# Patient Record
Sex: Male | Born: 2000 | Race: Black or African American | Hispanic: No | Marital: Single | Smoking: Never smoker
Health system: Southern US, Community
[De-identification: ages and names within clinical notes are randomized; demographics above are authoritative.]

## PROBLEM LIST (undated history)

## (undated) DIAGNOSIS — D573 Sickle-cell trait: Secondary | ICD-10-CM

## (undated) DIAGNOSIS — A749 Chlamydial infection, unspecified: Secondary | ICD-10-CM

---
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
## 2022-11-22 ENCOUNTER — Other Ambulatory Visit: Payer: Self-pay

## 2022-11-22 ENCOUNTER — Emergency Department (HOSPITAL_COMMUNITY)
Admission: EM | Admit: 2022-11-22 | Discharge: 2022-11-22 | Disposition: A | Discharge status: Home / Self Care | Payer: Medicaid Other | Attending: Emergency Medicine | Admitting: Emergency Medicine

## 2022-11-22 DIAGNOSIS — Z202 Contact with and (suspected) exposure to infections with a predominantly sexual mode of transmission: Secondary | ICD-10-CM | POA: Insufficient documentation

## 2022-11-22 DIAGNOSIS — R369 Urethral discharge, unspecified: Secondary | ICD-10-CM | POA: Insufficient documentation

## 2022-11-22 MED ORDER — DOXYCYCLINE HYCLATE 100 MG PO TABS
100.0000 mg | ORAL_TABLET | Freq: Once | ORAL | Status: AC
  Administered 2022-11-22: 100 mg via ORAL
  Filled 2022-11-22: qty 1

## 2022-11-22 MED ORDER — LIDOCAINE HCL (PF) 1 % IJ SOLN
INTRAMUSCULAR | Status: AC
  Administered 2022-11-22: 2.1 mL
  Filled 2022-11-22: qty 5

## 2022-11-22 MED ORDER — CEFTRIAXONE SODIUM 500 MG IJ SOLR
500.0000 mg | Freq: Once | INTRAMUSCULAR | Status: AC
  Administered 2022-11-22: 500 mg via INTRAMUSCULAR
  Filled 2022-11-22: qty 500

## 2022-11-22 MED ORDER — DOXYCYCLINE HYCLATE 100 MG PO CAPS: 100.0000 mg | ORAL_CAPSULE | Freq: Two times a day (BID) | ORAL | 0 refills | Status: DC

## 2022-11-22 NOTE — ED Provider Notes (Signed)
  [facility]-EMERGENCY DEPT [facility] Emergency Department Provider Note MRN:  [id number]  Arrival date & time: 11/22/22     Chief Complaint   STD screening ( penile discharge)    History of Present Illness   Eugene Bryant is a 21 y.o. year-old male presents to the ED with chief complaint of penile discharge x 3 days.  States that it is a clear discharge.  Happens daily.  States his sexual partner has unknown STD and he would like to be tested.  Denies masses, lumps, rashes, lesions.  Denies dysuria or fever.  History provided by patient.   Review of Systems  Pertinent positive and negative review of systems noted in HPI.    Physical Exam   Vitals:   11/22/22 2211  BP: 136/78  Pulse: 88  Resp: 16  Temp: 98.3 F (36.8 C)  SpO2: 98%    CONSTITUTIONAL:  well-appearing, NAD NEURO:  Alert and oriented x 3, CN 3-12 grossly intact EYES:  eyes equal and reactive ENT/NECK:  Supple, no stridor  CARDIO:  appears well-perfused  PULM:  No respiratory distress,  GI/GU:  non-distended, mild urethral discharge MSK/SPINE:  No gross deformities, no edema, moves all extremities  SKIN:  no rash, atraumatic   *Additional and/or pertinent findings included in MDM below  Diagnostic and Interventional Summary    EKG Interpretation  Date/Time:    Ventricular Rate:    PR Interval:    QRS Duration:   QT Interval:    QTC Calculation:   R Axis:     Text Interpretation:         Labs Reviewed - No data to display  No orders to display    Medications  cefTRIAXone (ROCEPHIN) injection 500 mg (has no administration in time range)  doxycycline (VIBRA-TABS) tablet 100 mg (has no administration in time range)     Procedures  /  Critical Care Procedures  ED Course and Medical Decision Making  I have reviewed the triage vital signs, the nursing notes, and pertinent available records from the EMR.  Social Determinants Affecting Complexity of Care: Patient has no clinically  significant social determinants affecting this chief complaint..   ED Course:    Medical Decision Making Risk Prescription drug management.     Consultants: No consultations were needed in caring for this patient.   Treatment and Plan: Emergency department workup does not suggest an emergent condition requiring admission or immediate intervention beyond  what has been performed at this time. The patient is safe for discharge and has  been instructed to return immediately for worsening symptoms, change in  symptoms or any other concerns    Final Clinical Impressions(s) / ED Diagnoses     ICD-10-CM   1. Penile discharge  R36.9       ED Discharge Orders          Ordered    doxycycline (VIBRAMYCIN) 100 MG capsule  2 times daily        11/22/22 2219              Discharge Instructions Discussed with and Provided to Patient:   Discharge Instructions   None      Roxy Horseman, PA-C 11/22/22 2222    Charlynne Pander, MD 11/22/22 [phone]
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
## 2022-11-23 LAB — GC/CHLAMYDIA PROBE AMP (~~LOC~~) NOT AT ARMC
Chlamydia: POSITIVE — AB
Comment: NEGATIVE
Comment: NORMAL
Neisseria Gonorrhea: NEGATIVE

## 2023-03-25 ENCOUNTER — Ambulatory Visit
Admission: RE | Admit: 2023-03-25 | Discharge: 2023-03-25 | Disposition: A | Discharge status: Home / Self Care | Payer: Self-pay | Source: Ambulatory Visit | Attending: Nurse Practitioner | Admitting: Nurse Practitioner

## 2023-03-25 VITALS — BP 120/74 | HR 55 | Temp 97.9°F | Resp 16

## 2023-03-25 DIAGNOSIS — R369 Urethral discharge, unspecified: Secondary | ICD-10-CM | POA: Insufficient documentation

## 2023-03-25 HISTORY — DX: Chlamydial infection, unspecified: A74.9

## 2023-03-25 MED ORDER — DOXYCYCLINE HYCLATE 100 MG PO CAPS: 100.0000 mg | ORAL_CAPSULE | Freq: Two times a day (BID) | ORAL | 0 refills | Status: AC

## 2023-03-25 NOTE — ED Triage Notes (Signed)
Pt c/o penile discharge onset approx 3 days ago.

## 2023-03-25 NOTE — ED Provider Notes (Signed)
[facility]    CSN: [id number] Arrival date & time: 03/25/23  1117      History   Chief Complaint Chief Complaint  Patient presents with   Penile Discharge    HPI Eugene Bryant is a 22 y.o. male presents for evaluation of penile discharge.  Patient reports 3 days of a runny penile discharge.  He denies any fevers, chills, testicular pain or swelling, dysuria.  He denies any known STD exposure but would like to be tested.  He has been treated for chlamydia in the past and states the symptoms are similar.  No OTC medications have been used.  No other concerns at this time.   Penile Discharge    Past Medical History:  Diagnosis Date   Chlamydia    Sickle cell trait     There are no problems to display for this patient.   History reviewed. No pertinent surgical history.     Home Medications    Prior to Admission medications   Medication Sig Start Date End Date Taking? Authorizing Provider  doxycycline (VIBRAMYCIN) 100 MG capsule Take 1 capsule (100 mg total) by mouth 2 (two) times daily for 7 days. 03/25/23 04/01/23 Yes Radford Pax, NP  ibuprofen (EQL IBUPROFEN) 200 MG tablet Take 3 tablets (600 mg total) by mouth 3 (three) times daily. 11/30/13   Reuben Likes, MD    Family History History reviewed. No pertinent family history.  Social History Social History   Tobacco Use   Smoking status: Never   Smokeless tobacco: Never  Vaping Use   Vaping Use: Every day  Substance Use Topics   Alcohol use: No   Drug use: Yes    Types: Marijuana     Allergies   Patient has no known allergies.   Review of Systems Review of Systems  Genitourinary:  Positive for penile discharge.     Physical Exam Triage Vital Signs ED Triage Vitals [03/25/23 1129]  Enc Vitals Group     BP 120/74     Pulse Rate (!) 55     Resp 16     Temp 97.9 F (36.6 C)     Temp Source Oral     SpO2 99 %     Weight      Height      Head Circumference      Peak Flow       Pain Score 0     Pain Loc      Pain Edu?      Excl. in GC?    No data found.  Updated Vital Signs BP 120/74   Pulse (!) 55   Temp 97.9 F (36.6 C) (Oral)   Resp 16   SpO2 99%   Visual Acuity Right Eye Distance:   Left Eye Distance:   Bilateral Distance:    Right Eye Near:   Left Eye Near:    Bilateral Near:     Physical Exam Vitals and nursing note reviewed.  Constitutional:      Appearance: Normal appearance.  HENT:     Head: Normocephalic and atraumatic.  Eyes:     Pupils: Pupils are equal, round, and reactive to light.  Cardiovascular:     Rate and Rhythm: Normal rate.  Pulmonary:     Effort: Pulmonary effort is normal.  Abdominal:     Tenderness: There is no right CVA tenderness or left CVA tenderness.  Skin:    General: Skin is warm and dry.  Neurological:     General: No focal deficit present.     Mental Status: He is alert and oriented to person, place, and time.  Psychiatric:        Mood and Affect: Mood normal.        Behavior: Behavior normal.      UC Treatments / Results  Labs (all labs ordered are listed, but only abnormal results are displayed) Labs Reviewed  RPR  HIV ANTIBODY (ROUTINE TESTING W REFLEX)  CYTOLOGY, (ORAL, ANAL, URETHRAL) ANCILLARY ONLY    EKG   Radiology No results found.  Procedures Procedures (including critical care time)  Medications Ordered in UC Medications - No data to display  Initial Impression / Assessment and Plan / UC Course  I have reviewed the triage vital signs and the nursing notes.  Pertinent labs & imaging results that were available during my care of the patient were reviewed by me and considered in my medical decision making (see chart for details).     STD testing is ordered and will contact for any positive results Start doxycycline twice daily for 7 days PCP follow-up if symptoms do not improve ER precautions reviewed and patient verbalized understanding Final Clinical Impressions(s)  / UC Diagnoses   Final diagnoses:  Penile discharge     Discharge Instructions      The clinic will contact you with results of the testing done today if positive Start doxycycline twice daily for 7 days Follow-up with your PCP if your symptoms or not improving Please go to the emergency room if you have any worsening symptoms    ED Prescriptions     Medication Sig Dispense Auth. Provider   doxycycline (VIBRAMYCIN) 100 MG capsule Take 1 capsule (100 mg total) by mouth 2 (two) times daily for 7 days. 14 capsule Radford Pax, NP      PDMP not reviewed this encounter.   Radford Pax, NP 03/25/23 1145

## 2023-03-25 NOTE — Discharge Instructions (Signed)
The clinic will contact you with results of the testing done today if positive Start doxycycline twice daily for 7 days Follow-up with your PCP if your symptoms or not improving Please go to the emergency room if you have any worsening symptoms

## 2023-03-26 LAB — CYTOLOGY, (ORAL, ANAL, URETHRAL) ANCILLARY ONLY
Chlamydia: NEGATIVE
Comment: NEGATIVE
Comment: NEGATIVE
Comment: NORMAL
Neisseria Gonorrhea: NEGATIVE
Trichomonas: NEGATIVE

## 2023-03-27 LAB — HIV ANTIBODY (ROUTINE TESTING W REFLEX): HIV Screen 4th Generation wRfx: NONREACTIVE

## 2023-03-27 LAB — SYPHILIS: RPR W/REFLEX TO RPR TITER AND TREPONEMAL ANTIBODIES, TRADITIONAL SCREENING AND DIAGNOSIS ALGORITHM: RPR Ser Ql: NONREACTIVE

## 2023-04-25 ENCOUNTER — Encounter (HOSPITAL_COMMUNITY): Payer: Self-pay

## 2023-04-25 ENCOUNTER — Ambulatory Visit (HOSPITAL_COMMUNITY)
Admission: RE | Admit: 2023-04-25 | Discharge: 2023-04-25 | Disposition: A | Discharge status: Home / Self Care | Payer: Medicaid Other | Source: Ambulatory Visit | Attending: Physician Assistant | Admitting: Physician Assistant

## 2023-04-25 VITALS — BP 111/74 | HR 90 | Temp 99.0°F | Resp 14

## 2023-04-25 DIAGNOSIS — R369 Urethral discharge, unspecified: Secondary | ICD-10-CM | POA: Diagnosis present

## 2023-04-25 DIAGNOSIS — R300 Dysuria: Secondary | ICD-10-CM | POA: Insufficient documentation

## 2023-04-25 LAB — POCT URINALYSIS DIP (MANUAL ENTRY)
Blood, UA: NEGATIVE
Glucose, UA: NEGATIVE mg/dL
Ketones, POC UA: NEGATIVE mg/dL
Nitrite, UA: NEGATIVE
Protein Ur, POC: 30 mg/dL — AB
Spec Grav, UA: 1.025 (ref 1.010–1.025)
Urobilinogen, UA: 1 E.U./dL
pH, UA: 6 (ref 5.0–8.0)

## 2023-04-25 MED ORDER — DOXYCYCLINE HYCLATE 100 MG PO CAPS: 100.0000 mg | ORAL_CAPSULE | Freq: Two times a day (BID) | ORAL | 0 refills | Status: DC

## 2023-04-25 NOTE — ED Provider Notes (Signed)
[facility]    CSN: [id number] Arrival date & time: 04/25/23  1654      History   Chief Complaint Chief Complaint  Patient presents with   appt 5   Penile Discharge    HPI Eugene Bryant is a 22 y.o. male.   Patient presents today with a 24-hour history of dysuria and penile discharge.  He describes penile discharge as runny and white.  He denies any genital lesions, abdominal pain, pelvic pain, fever, nausea, vomiting.  He does have a history of chlamydia and reports current symptoms are similar to previous episodes of this condition.  He was seen by our clinic on 03/25/2023 with similar symptoms and empirically treated with doxycycline.  Ultimately his STI swab was negative and he also tested negative for HIV and syphilis.  He is sexually active with male partners and typically uses condoms.  He denies additional antibiotic use in the past 90 days.  He is very concerned about ongoing symptoms and is requesting treatment today.  He has not seen a urologist in the past.  Denies any history of urogenital procedures or catheterization.  Denies history of diabetes and does not take SGLT2 inhibitor.    Past Medical History:  Diagnosis Date   Chlamydia    Sickle cell trait (HCC)     There are no problems to display for this patient.   History reviewed. No pertinent surgical history.     Home Medications    Prior to Admission medications   Medication Sig Start Date End Date Taking? Authorizing Provider  doxycycline (VIBRAMYCIN) 100 MG capsule Take 1 capsule (100 mg total) by mouth 2 (two) times daily. 04/25/23  Yes Hiroyuki Ozanich K, PA-C  ibuprofen (EQL IBUPROFEN) 200 MG tablet Take 3 tablets (600 mg total) by mouth 3 (three) times daily. 11/30/13   Reuben Likes, MD    Family History History reviewed. No pertinent family history.  Social History Social History   Tobacco Use   Smoking status: Never   Smokeless tobacco: Never  Vaping Use   Vaping Use: Every day   Substance Use Topics   Alcohol use: No   Drug use: Yes    Types: Marijuana     Allergies   Patient has no known allergies.   Review of Systems Review of Systems  Constitutional:  Positive for activity change. Negative for appetite change, fatigue and fever.  Gastrointestinal:  Negative for abdominal pain, diarrhea, nausea and vomiting.  Genitourinary:  Positive for dysuria and penile discharge. Negative for frequency, genital sores, hematuria, penile pain and urgency.     Physical Exam Triage Vital Signs ED Triage Vitals [04/25/23 1725]  Enc Vitals Group     BP 111/74     Pulse Rate 90     Resp 14     Temp 99 F (37.2 C)     Temp Source Oral     SpO2 98 %     Weight      Height      Head Circumference      Peak Flow      Pain Score 0     Pain Loc      Pain Edu?      Excl. in GC?    No data found.  Updated Vital Signs BP 111/74 (BP Location: Left Arm)   Pulse 90   Temp 99 F (37.2 C) (Oral)   Resp 14   SpO2 98%   Visual Acuity Right Eye Distance:  Left Eye Distance:   Bilateral Distance:    Right Eye Near:   Left Eye Near:    Bilateral Near:     Physical Exam Vitals reviewed.  Constitutional:      General: He is awake.     Appearance: Normal appearance. He is well-developed. He is not ill-appearing.     Comments: Very pleasant male appears stated age no acute distress sitting comfortably in exam room  HENT:     Head: Normocephalic and atraumatic.  Cardiovascular:     Rate and Rhythm: Normal rate and regular rhythm.     Heart sounds: Normal heart sounds, S1 normal and S2 normal. No murmur heard. Pulmonary:     Effort: Pulmonary effort is normal.     Breath sounds: Normal breath sounds. No stridor. No wheezing, rhonchi or rales.     Comments: Clear to auscultation bilaterally Abdominal:     General: Bowel sounds are normal.     Palpations: Abdomen is soft.     Tenderness: There is no abdominal tenderness. There is no right CVA tenderness,  left CVA tenderness, guarding or rebound.  Genitourinary:    Comments: Exam declined Neurological:     Mental Status: He is alert.  Psychiatric:        Behavior: Behavior is cooperative.      UC Treatments / Results  Labs (all labs ordered are listed, but only abnormal results are displayed) Labs Reviewed  POCT URINALYSIS DIP (MANUAL ENTRY) - Abnormal; Notable for the following components:      Result Value   Bilirubin, UA small (*)    Protein Ur, POC =30 (*)    Leukocytes, UA Trace (*)    All other components within normal limits  URINE CULTURE  CYTOLOGY, (ORAL, ANAL, URETHRAL) ANCILLARY ONLY    EKG   Radiology No results found.  Procedures Procedures (including critical care time)  Medications Ordered in UC Medications - No data to display  Initial Impression / Assessment and Plan / UC Course  I have reviewed the triage vital signs and the nursing notes.  Pertinent labs & imaging results that were available during my care of the patient were reviewed by me and considered in my medical decision making (see chart for details).     Patient is well-appearing, afebrile, nontoxic, nontachycardic.  UA showed trace leukocytes so we will send this for culture given he is having dysuria but defer additional antibiotic treatment until culture results are available.  STI swab was collected today and is pending.  We discussed potential utility of waiting until we have results given he has had similar symptoms in the past with negative testing but patient reports that he is very concerned about chlamydia and would like to initiate treatment.  Will treat for chlamydia with doxycycline 100 mg twice daily for 7 days.  Discussed that he is to avoid prolonged sun exposure while on this medication.  Offered HIV and syphilis testing which patient declined as he had this approximately 1 month ago and it was negative.  We will contact him if we need to arrange additional treatment based on his  swab result.  Discussed the importance of safe sex practices.  He is to abstain from sex until 1 week after completing course of treatment.  If he can test negative on the STI swab he will need to see urology for further evaluation and management was given contact information for local provider with instruction to call to schedule an appointment.  Strict  return precautions given.  Final Clinical Impressions(s) / UC Diagnoses   Final diagnoses:  Penile discharge  Dysuria     Discharge Instructions      We are treating you for chlamydia.  Take doxycycline 100 mg twice daily for 7 days.  Abstain from sex for 1 week after completing course of medication (a minimum of 2 weeks).  We will contact you if your swab is positive for anything else we need to arrange treatment.  If you test negative and you continue to have symptoms I do recommend that you follow-up with urologist; call to schedule an appointment.  Make sure that you are drinking plenty of fluid.  If you have any worsening symptoms including genital lesions, pelvic pain, abdominal pain, fever, nausea, vomiting you should be seen immediately.     ED Prescriptions     Medication Sig Dispense Auth. Provider   doxycycline (VIBRAMYCIN) 100 MG capsule Take 1 capsule (100 mg total) by mouth 2 (two) times daily. 14 capsule Lakiya Cottam K, PA-C      PDMP not reviewed this encounter.   Jeani Hawking, PA-C 04/25/23 1748

## 2023-04-25 NOTE — ED Triage Notes (Signed)
Pt c/o white penile discharge since this morning with little penile irritation. Requesting STD testing.

## 2023-04-25 NOTE — Discharge Instructions (Addendum)
We are treating you for chlamydia.  Take doxycycline 100 mg twice daily for 7 days.  Abstain from sex for 1 week after completing course of medication (a minimum of 2 weeks).  We will contact you if your swab is positive for anything else we need to arrange treatment.  If you test negative and you continue to have symptoms I do recommend that you follow-up with urologist; call to schedule an appointment.  Make sure that you are drinking plenty of fluid.  If you have any worsening symptoms including genital lesions, pelvic pain, abdominal pain, fever, nausea, vomiting you should be seen immediately.

## 2023-04-26 LAB — URINE CULTURE: Culture: NO GROWTH

## 2023-04-26 LAB — CYTOLOGY, (ORAL, ANAL, URETHRAL) ANCILLARY ONLY
Chlamydia: POSITIVE — AB
Comment: NEGATIVE
Comment: NEGATIVE
Comment: NORMAL
Neisseria Gonorrhea: NEGATIVE
Trichomonas: NEGATIVE

## 2023-07-25 ENCOUNTER — Ambulatory Visit
Admission: EM | Admit: 2023-07-25 | Discharge: 2023-07-25 | Disposition: A | Discharge status: Home / Self Care | Payer: Medicaid Other | Attending: Physician Assistant | Admitting: Physician Assistant

## 2023-07-25 DIAGNOSIS — Z202 Contact with and (suspected) exposure to infections with a predominantly sexual mode of transmission: Secondary | ICD-10-CM | POA: Insufficient documentation

## 2023-07-25 MED ORDER — DOXYCYCLINE HYCLATE 100 MG PO CAPS: 100.0000 mg | ORAL_CAPSULE | Freq: Two times a day (BID) | ORAL | 0 refills | Status: DC

## 2023-07-25 NOTE — ED Provider Notes (Signed)
[facility]    CSN: [id number] Arrival date & time: 07/25/23  1000      History   Chief Complaint Chief Complaint  Patient presents with   Exposure to STD    HPI Eugene Bryant is a 22 y.o. male.   Patient today for evaluation of possible STD after he had known exposure to chlamydia.  He states he has had some clear discharge as well as some mild dysuria.  He does not report any fever.  He has not had any nausea or vomiting.  The history is provided by the patient.    Past Medical History:  Diagnosis Date   Chlamydia    Sickle cell trait (HCC)     There are no problems to display for this patient.   History reviewed. No pertinent surgical history.     Home Medications    Prior to Admission medications   Medication Sig Start Date End Date Taking? Authorizing Provider  doxycycline (VIBRAMYCIN) 100 MG capsule Take 1 capsule (100 mg total) by mouth 2 (two) times daily. 07/25/23  Yes Tomi Bamberger, PA-C  ibuprofen (EQL IBUPROFEN) 200 MG tablet Take 3 tablets (600 mg total) by mouth 3 (three) times daily. 11/30/13   Reuben Likes, MD    Family History History reviewed. No pertinent family history.  Social History Social History   Tobacco Use   Smoking status: Never   Smokeless tobacco: Never  Vaping Use   Vaping status: Every Day  Substance Use Topics   Alcohol use: No   Drug use: Yes    Types: Marijuana     Allergies   Patient has no known allergies.   Review of Systems Review of Systems  Constitutional:  Negative for chills and fever.  Eyes:  Negative for discharge and redness.  Genitourinary:  Positive for dysuria and penile discharge. Negative for genital sores.  Neurological:  Negative for numbness.     Physical Exam Triage Vital Signs ED Triage Vitals  Encounter Vitals Group     BP      Systolic BP Percentile      Diastolic BP Percentile      Pulse      Resp      Temp      Temp src      SpO2      Weight       Height      Head Circumference      Peak Flow      Pain Score      Pain Loc      Pain Education      Exclude from Growth Chart    No data found.  Updated Vital Signs BP 130/74 (BP Location: Left Arm)   Pulse 70   Temp 98.7 F (37.1 C) (Oral)   Resp 18   SpO2 98%     Physical Exam Vitals and nursing note reviewed.  Constitutional:      General: He is not in acute distress.    Appearance: Normal appearance. He is not ill-appearing.  HENT:     Head: Normocephalic and atraumatic.  Eyes:     Conjunctiva/sclera: Conjunctivae normal.  Cardiovascular:     Rate and Rhythm: Normal rate.  Pulmonary:     Effort: Pulmonary effort is normal. No respiratory distress.  Neurological:     Mental Status: He is alert.  Psychiatric:        Mood and Affect: Mood normal.  Behavior: Behavior normal.        Thought Content: Thought content normal.      UC Treatments / Results  Labs (all labs ordered are listed, but only abnormal results are displayed) Labs Reviewed  CYTOLOGY, (ORAL, ANAL, URETHRAL) ANCILLARY ONLY    EKG   Radiology No results found.  Procedures Procedures (including critical care time)  Medications Ordered in UC Medications - No data to display  Initial Impression / Assessment and Plan / UC Course  I have reviewed the triage vital signs and the nursing notes.  Pertinent labs & imaging results that were available during my care of the patient were reviewed by me and considered in my medical decision making (see chart for details).    Doxycycline prescribed to cover chlamydia and STD screening ordered to rule out any additional infection.  Patient declines blood work.  Encouraged follow up if no gradual improvement or with any further concerns.   Final Clinical Impressions(s) / UC Diagnoses   Final diagnoses:  Exposure to chlamydia   Discharge Instructions   None    ED Prescriptions     Medication Sig Dispense Auth. Provider   doxycycline  (VIBRAMYCIN) 100 MG capsule Take 1 capsule (100 mg total) by mouth 2 (two) times daily. 20 capsule Tomi Bamberger, PA-C      PDMP not reviewed this encounter.   Tomi Bamberger, PA-C 07/25/23 1048

## 2023-07-25 NOTE — ED Triage Notes (Signed)
Pt presents with clear penile discharge and burning with urination that started a week ago. Pt states he was exposed to chlamydia.

## 2023-08-15 ENCOUNTER — Encounter (HOSPITAL_COMMUNITY): Payer: Self-pay

## 2023-08-15 ENCOUNTER — Ambulatory Visit (HOSPITAL_COMMUNITY)
Admission: EM | Admit: 2023-08-15 | Discharge: 2023-08-15 | Disposition: A | Discharge status: Home / Self Care | Payer: Medicaid Other | Attending: Urgent Care | Admitting: Urgent Care

## 2023-08-15 DIAGNOSIS — K047 Periapical abscess without sinus: Secondary | ICD-10-CM | POA: Diagnosis not present

## 2023-08-15 MED ORDER — CHLORHEXIDINE GLUCONATE 0.12 % MT SOLN: 15.0000 mL | Freq: Two times a day (BID) | OROMUCOSAL | 0 refills | Status: DC

## 2023-08-15 MED ORDER — AMOXICILLIN-POT CLAVULANATE 875-125 MG PO TABS: 1.0000 | ORAL_TABLET | Freq: Two times a day (BID) | ORAL | 0 refills | Status: DC

## 2023-08-15 MED ORDER — CELECOXIB 200 MG PO CAPS: 200.0000 mg | ORAL_CAPSULE | Freq: Two times a day (BID) | ORAL | 0 refills | Status: DC

## 2023-08-15 NOTE — Discharge Instructions (Addendum)
Please start taking the antibiotic prescribed.  This will help prevent any worsening infection. Take it twice daily with food. It may cause soft stools. Start taking the medication for the pain as prescribed.  Use only for moderate pain; take with food. For milder pain, you may take up to 1000 mg of Tylenol every 8 hours as needed. Swish and spit the prescribed mouth wash twice daily; swish for 5 minutes. Please follow-up with a dentist as soon as possible for further evaluation.

## 2023-08-15 NOTE — ED Triage Notes (Signed)
Patient with c/o left lower dental pain. States he had a tooth chip and now it is swollen. States he took some sort of "headache medicine" PTA.

## 2023-08-15 NOTE — ED Provider Notes (Signed)
[facility]    CSN: [id number] Arrival date & time: 08/15/23  1811      History   Chief Complaint Chief Complaint  Patient presents with   Dental Pain    HPI Eugene Bryant is a 22 y.o. male.   Eugene Bryant male presents today due to pain in his gums and teeth primarily on the bottom R jaw near the back three molars. States that it has been bothering him for several months. Has noted a "bump" in the past and has been able to squeeze out pus. States over the past two weeks however, the swelling and pain has gotten worse. Reports an 8/10 pain currently. Has not been taking anything OTC for it. Denies lymphadenopathy, fever or dysphagia. No drooling or trismus.   Dental Pain   Past Medical History:  Diagnosis Date   Chlamydia    Sickle cell trait (HCC)     There are no problems to display for this patient.   History reviewed. No pertinent surgical history.     Home Medications    Prior to Admission medications   Medication Sig Start Date End Date Taking? Authorizing Provider  amoxicillin-clavulanate (AUGMENTIN) 875-125 MG tablet Take 1 tablet by mouth 2 (two) times daily with a meal. 08/15/23  Yes Arlynn Stare L, PA  celecoxib (CELEBREX) 200 MG capsule Take 1 capsule (200 mg total) by mouth 2 (two) times daily. 08/15/23  Yes Lanee Chain L, PA  chlorhexidine (PERIDEX) 0.12 % solution Use as directed 15 mLs in the mouth or throat 2 (two) times daily. Swish for up to 5 minutes, then spit out 08/15/23  Yes Gema Ringold L, PA    Family History History reviewed. No pertinent family history.  Social History Social History   Tobacco Use   Smoking status: Never   Smokeless tobacco: Never  Vaping Use   Vaping status: Every Day  Substance Use Topics   Alcohol use: No   Drug use: Yes    Types: Marijuana     Allergies   Patient has no known allergies.   Review of Systems Review of Systems As per HPI  Physical Exam Triage Vital Signs ED Triage Vitals   Encounter Vitals Group     BP 08/15/23 1859 131/72     Systolic BP Percentile --      Diastolic BP Percentile --      Pulse Rate 08/15/23 1859 77     Resp 08/15/23 1859 16     Temp 08/15/23 1859 98.4 F (36.9 C)     Temp Source 08/15/23 1859 Oral     SpO2 08/15/23 1859 98 %     Weight --      Height --      Head Circumference --      Peak Flow --      Pain Score 08/15/23 1900 7     Pain Loc --      Pain Education --      Exclude from Growth Chart --    No data found.  Updated Vital Signs BP 131/72 (BP Location: Left Arm)   Pulse 77   Temp 98.4 F (36.9 C) (Oral)   Resp 16   SpO2 98%   Visual Acuity Right Eye Distance:   Left Eye Distance:   Bilateral Distance:    Right Eye Near:   Left Eye Near:    Bilateral Near:     Physical Exam Constitutional:      General: He is not  in acute distress.    Appearance: Normal appearance. He is normal weight. He is not ill-appearing, toxic-appearing or diaphoretic.  HENT:     Head: Normocephalic.     Mouth/Throat:     Lips: Pink. No lesions.     Mouth: Mucous membranes are moist.     Dentition: Abnormal dentition. Dental tenderness, gingival swelling, dental caries, dental abscesses and gum lesions present.     Palate: No mass and lesions.     Pharynx: Oropharynx is clear. Uvula midline. No pharyngeal swelling, oropharyngeal exudate, posterior oropharyngeal erythema, uvula swelling or postnasal drip.   Cardiovascular:     Rate and Rhythm: Normal rate.  Pulmonary:     Effort: Pulmonary effort is normal. No respiratory distress.  Musculoskeletal:     Cervical back: Normal range of motion and neck supple. No rigidity or tenderness.  Lymphadenopathy:     Cervical: No cervical adenopathy.  Skin:    Findings: No rash.  Neurological:     Mental Status: He is alert.      UC Treatments / Results  Labs (all labs ordered are listed, but only abnormal results are displayed) Labs Reviewed - No data to  display  EKG   Radiology No results found.  Procedures Procedures (including critical care time)  Medications Ordered in UC Medications - No data to display  Initial Impression / Assessment and Plan / UC Course  I have reviewed the triage vital signs and the nursing notes.  Pertinent labs & imaging results that were available during my care of the patient were reviewed by me and considered in my medical decision making (see chart for details).     Dental abscess - no systemic s/sx. Will start PO Augmentin and chlorhexidine for infection. Celebrex for pain/ swelling. Pt to contact dentist ASAP to schedule further eval and consult. Pt understands that if he develops fever, facial swelling or trismus he must head to ER  Final Clinical Impressions(s) / UC Diagnoses   Final diagnoses:  Dental abscess     Discharge Instructions      Please start taking the antibiotic prescribed.  This will help prevent any worsening infection. Take it twice daily with food. It may cause soft stools. Start taking the medication for the pain as prescribed.  Use only for moderate pain; take with food. For milder pain, you may take up to 1000 mg of Tylenol every 8 hours as needed. Swish and spit the prescribed mouth wash twice daily; swish for 5 minutes. Please follow-up with a dentist as soon as possible for further evaluation.     ED Prescriptions     Medication Sig Dispense Auth. Provider   amoxicillin-clavulanate (AUGMENTIN) 875-125 MG tablet Take 1 tablet by mouth 2 (two) times daily with a meal. 20 tablet Muhanad Torosyan L, PA   chlorhexidine (PERIDEX) 0.12 % solution Use as directed 15 mLs in the mouth or throat 2 (two) times daily. Swish for up to 5 minutes, then spit out 120 mL Trenna Kiely L, PA   celecoxib (CELEBREX) 200 MG capsule Take 1 capsule (200 mg total) by mouth 2 (two) times daily. 20 capsule Chakia Counts L, PA      PDMP not reviewed this encounter.   Maretta Bees, [place] 08/15/23 2259

## 2023-09-21 ENCOUNTER — Ambulatory Visit
Admission: EM | Admit: 2023-09-21 | Discharge: 2023-09-21 | Disposition: A | Discharge status: Home / Self Care | Payer: Medicaid Other | Attending: Internal Medicine | Admitting: Internal Medicine

## 2023-09-21 DIAGNOSIS — R369 Urethral discharge, unspecified: Secondary | ICD-10-CM | POA: Insufficient documentation

## 2023-09-21 MED ORDER — DOXYCYCLINE HYCLATE 100 MG PO CAPS: 100.0000 mg | ORAL_CAPSULE | Freq: Two times a day (BID) | ORAL | 0 refills | Status: AC

## 2023-09-21 NOTE — ED Triage Notes (Signed)
Pt states he was sexually active with his current partner while on the 7 day treatment for chlamydia and wants to be retested. Pt states he is having penial discharge that started 2 days ago.

## 2023-09-21 NOTE — ED Provider Notes (Signed)
[facility]    CSN: [id number] Arrival date & time: 09/21/23  1040      History   Chief Complaint Chief Complaint  Patient presents with   Exposure to STD    HPI Eugene Bryant is a 22 y.o. male presents for penile discharge. Pt reports 2 days of a penile discharge. He was treated for chlamydia in August with resolution of sx. he did state he was continuing to have intercourse with his partner during treatment.  He states his partner was also treated at that time.  He denies any known STD exposure given his current symptoms.  No dysuria, fevers.  He states his symptoms are consistent with his previous chlamydia symptoms.  No other concerns at this time.  Exposure to STD    Past Medical History:  Diagnosis Date   Chlamydia    Sickle cell trait (HCC)     There are no problems to display for this patient.   History reviewed. No pertinent surgical history.     Home Medications    Prior to Admission medications   Medication Sig Start Date End Date Taking? Authorizing Provider  doxycycline (VIBRAMYCIN) 100 MG capsule Take 1 capsule (100 mg total) by mouth 2 (two) times daily for 7 days. 09/21/23 09/28/23 Yes Radford Pax, NP  celecoxib (CELEBREX) 200 MG capsule Take 1 capsule (200 mg total) by mouth 2 (two) times daily. 08/15/23   Crain, Whitney L, PA  chlorhexidine (PERIDEX) 0.12 % solution Use as directed 15 mLs in the mouth or throat 2 (two) times daily. Swish for up to 5 minutes, then spit out 08/15/23   Maretta Bees, PA    Family History History reviewed. No pertinent family history.  Social History Social History   Tobacco Use   Smoking status: Never   Smokeless tobacco: Never  Vaping Use   Vaping status: Every Day  Substance Use Topics   Alcohol use: No   Drug use: Yes    Types: Marijuana     Allergies   Patient has no known allergies.   Review of Systems Review of Systems  Genitourinary:  Positive for penile discharge.      Physical Exam Triage Vital Signs ED Triage Vitals  Encounter Vitals Group     BP 09/21/23 1133 123/72     Systolic BP Percentile --      Diastolic BP Percentile --      Pulse Rate 09/21/23 1133 61     Resp 09/21/23 1133 16     Temp 09/21/23 1133 98.8 F (37.1 C)     Temp Source 09/21/23 1133 Oral     SpO2 09/21/23 1133 98 %     Weight --      Height --      Head Circumference --      Peak Flow --      Pain Score 09/21/23 1134 0     Pain Loc --      Pain Education --      Exclude from Growth Chart --    No data found.  Updated Vital Signs BP 123/72 (BP Location: Left Arm)   Pulse 61   Temp 98.8 F (37.1 C) (Oral)   Resp 16   SpO2 98%   Visual Acuity Right Eye Distance:   Left Eye Distance:   Bilateral Distance:    Right Eye Near:   Left Eye Near:    Bilateral Near:     Physical Exam Vitals and  nursing note reviewed.  Constitutional:      Appearance: Normal appearance.  HENT:     Head: Normocephalic and atraumatic.  Eyes:     Pupils: Pupils are equal, round, and reactive to light.  Cardiovascular:     Rate and Rhythm: Normal rate.  Pulmonary:     Effort: Pulmonary effort is normal.  Skin:    General: Skin is warm and dry.  Neurological:     General: No focal deficit present.     Mental Status: He is alert and oriented to person, place, and time.  Psychiatric:        Mood and Affect: Mood normal.        Behavior: Behavior normal.      UC Treatments / Results  Labs (all labs ordered are listed, but only abnormal results are displayed) Labs Reviewed  CYTOLOGY, (ORAL, ANAL, URETHRAL) ANCILLARY ONLY    EKG   Radiology No results found.  Procedures Procedures (including critical care time)  Medications Ordered in UC Medications - No data to display  Initial Impression / Assessment and Plan / UC Course  I have reviewed the triage vital signs and the nursing notes.  Pertinent labs & imaging results that were available during my care  of the patient were reviewed by me and considered in my medical decision making (see chart for details).     Reviewed exam and symptoms with patient.  No red flags.  STD testing is ordered and will contact for any positive results.  Will start doxycycline twice daily for 7 days given sx.  Patient was informed no sexual intercourse for 7 days while on treatment as well as for 7 days after he completes treatment and he verbalized understanding.  Follow-up with PCP if symptoms do not improve.  ER precautions reviewed. Final Clinical Impressions(s) / UC Diagnoses   Final diagnoses:  Penile discharge     Discharge Instructions      The clinic will contact you with results of the testing done today if positive.  Please start doxycycline twice daily for 7 days.  Please avoid sexual intercourse for the 7 days while you are on treatment as well as for 7 days after you complete treatment.  Please follow-up with your PCP if your symptoms do not improve.  Please go to the emergency room if you develop any worsening symptoms.  I hope you feel better soon!    ED Prescriptions     Medication Sig Dispense Auth. Provider   doxycycline (VIBRAMYCIN) 100 MG capsule Take 1 capsule (100 mg total) by mouth 2 (two) times daily for 7 days. 14 capsule Radford Pax, NP      PDMP not reviewed this encounter.   Radford Pax, NP 09/21/23 [phone]

## 2023-09-21 NOTE — Discharge Instructions (Signed)
The clinic will contact you with results of the testing done today if positive.  Please start doxycycline twice daily for 7 days.  Please avoid sexual intercourse for the 7 days while you are on treatment as well as for 7 days after you complete treatment.  Please follow-up with your PCP if your symptoms do not improve.  Please go to the emergency room if you develop any worsening symptoms.  I hope you feel better soon!

## 2023-09-24 LAB — CYTOLOGY, (ORAL, ANAL, URETHRAL) ANCILLARY ONLY
Chlamydia: NEGATIVE
Comment: NEGATIVE
Comment: NEGATIVE
Comment: NORMAL
Neisseria Gonorrhea: NEGATIVE
Trichomonas: NEGATIVE

## 2024-01-02 ENCOUNTER — Ambulatory Visit: Payer: Medicaid Other

## 2024-01-03 ENCOUNTER — Ambulatory Visit: Payer: Medicaid Other

## 2024-01-04 ENCOUNTER — Encounter: Payer: Self-pay | Admitting: Emergency Medicine

## 2024-01-04 ENCOUNTER — Ambulatory Visit
Admission: EM | Admit: 2024-01-04 | Discharge: 2024-01-04 | Disposition: A | Discharge status: Home / Self Care | Payer: Medicaid Other | Attending: Internal Medicine | Admitting: Internal Medicine

## 2024-01-04 DIAGNOSIS — Z113 Encounter for screening for infections with a predominantly sexual mode of transmission: Secondary | ICD-10-CM | POA: Diagnosis present

## 2024-01-04 DIAGNOSIS — R300 Dysuria: Secondary | ICD-10-CM | POA: Insufficient documentation

## 2024-01-04 LAB — POCT URINALYSIS DIP (MANUAL ENTRY)
Bilirubin, UA: NEGATIVE
Blood, UA: NEGATIVE
Glucose, UA: NEGATIVE mg/dL
Ketones, POC UA: NEGATIVE mg/dL
Nitrite, UA: NEGATIVE
Protein Ur, POC: NEGATIVE mg/dL
Spec Grav, UA: 1.02 (ref 1.010–1.025)
Urobilinogen, UA: 0.2 U/dL
pH, UA: 6 (ref 5.0–8.0)

## 2024-01-04 MED ORDER — NITROFURANTOIN MONOHYD MACRO 100 MG PO CAPS: 100.0000 mg | ORAL_CAPSULE | Freq: Two times a day (BID) | ORAL | 0 refills | Status: DC

## 2024-01-04 NOTE — Discharge Instructions (Signed)
If the urine culture and STD test come back negative and you still have the burning, please go follow up with a urologist.

## 2024-01-04 NOTE — ED Provider Notes (Signed)
[facility]    CSN: [id number] Arrival date & time: 01/04/24  0849      History   Chief Complaint Chief Complaint  Patient presents with   Exposure to STD    HPI Eugene Bryant is a 23 y.o. male presents requesting STD testing. Denies knowledge of exposure with someone who is positive for STD's. Has been having intermittent dysuria since early December. Declined blood work. Has been in a monogamous relationship x 1 year with a male partner.     Past Medical History:  Diagnosis Date   Chlamydia    Sickle cell trait (HCC)     There are no active problems to display for this patient.   History reviewed. No pertinent surgical history.     Home Medications    Prior to Admission medications   Medication Sig Start Date End Date Taking? Authorizing Provider  celecoxib (CELEBREX) 200 MG capsule Take 1 capsule (200 mg total) by mouth 2 (two) times daily. Patient not taking: Reported on 01/04/2024 08/15/23   Guy Sandifer L, PA  chlorhexidine (PERIDEX) 0.12 % solution Use as directed 15 mLs in the mouth or throat 2 (two) times daily. Swish for up to 5 minutes, then spit out Patient not taking: Reported on 01/04/2024 08/15/23   Maretta Bees, PA    Family History History reviewed. No pertinent family history.  Social History Social History   Tobacco Use   Smoking status: Never   Smokeless tobacco: Never  Vaping Use   Vaping status: Every Day   Substances: Nicotine  Substance Use Topics   Alcohol use: Yes    Comment: social   Drug use: Yes    Types: Marijuana     Allergies   Patient has no known allergies.   Review of Systems Review of Systems  As noted in HPI Physical Exam Triage Vital Signs ED Triage Vitals  Encounter Vitals Group     BP 01/04/24 1114 124/72     Systolic BP Percentile --      Diastolic BP Percentile --      Pulse Rate 01/04/24 1114 78     Resp 01/04/24 1114 16     Temp 01/04/24 1114 98.5 F (36.9 C)     Temp  Source 01/04/24 1114 Oral     SpO2 01/04/24 1114 99 %     Weight --      Height --      Head Circumference --      Peak Flow --      Pain Score 01/04/24 1116 0     Pain Loc --      Pain Education --      Exclude from Growth Chart --    No data found.  Updated Vital Signs BP 124/72 (BP Location: Left Arm)   Pulse 78   Temp 98.5 F (36.9 C) (Oral)   Resp 16   SpO2 99%   Visual Acuity Right Eye Distance:   Left Eye Distance:   Bilateral Distance:    Right Eye Near:   Left Eye Near:    Bilateral Near:     Physical Exam Vitals and nursing note reviewed.  Constitutional:      General: He is not in acute distress.    Appearance: He is normal weight. He is not toxic-appearing.  HENT:     Right Ear: External ear normal.     Left Ear: External ear normal.     Nose: Nose normal.  Eyes:  General: No scleral icterus.    Conjunctiva/sclera: Conjunctivae normal.  Pulmonary:     Effort: Pulmonary effort is normal.  Musculoskeletal:        General: Normal range of motion.     Cervical back: Neck supple.  Skin:    General: Skin is warm and dry.  Neurological:     Mental Status: He is alert and oriented to person, place, and time.     Gait: Gait normal.  Psychiatric:        Mood and Affect: Mood normal.        Behavior: Behavior normal.        Thought Content: Thought content normal.        Judgment: Judgment normal.      UC Treatments / Results  Labs (all labs ordered are listed, but only abnormal results are displayed) Labs Reviewed  POCT URINALYSIS DIP (MANUAL ENTRY)    EKG   Radiology No results found.  Procedures Procedures (including critical care time)  Medications Ordered in UC Medications - No data to display  Initial Impression / Assessment and Plan / UC Course  I have reviewed the triage vital signs and the nursing notes.  Pertinent labs  results that were available during my care of the patient were reviewed by me and considered in my  medical decision making (see chart for details).  Dysuria, possible early URI Screen STD  I sent the urine for a culture and in the mean time I placed him on Macrobid as noted. We will inform him if the tests are positive.    Final Clinical Impressions(s) / UC Diagnoses   Final diagnoses:  None   Discharge Instructions   None    ED Prescriptions   None    PDMP not reviewed this encounter.   Garey Ham, PA-C 01/04/24 1205

## 2024-01-04 NOTE — ED Triage Notes (Signed)
Pt here to receive STD testing. Reports no known exposure. No discharge or skin changes reported. Pt reports only symptom has been dysuria with burning sensation since early December. Reports intermittent dysuria not consistent everyday. Pt does not want to complete blood work at this time only swab.

## 2024-01-06 LAB — URINE CULTURE: Special Requests: NORMAL

## 2024-01-07 ENCOUNTER — Telehealth: Payer: Self-pay | Admitting: Internal Medicine

## 2024-01-07 LAB — CYTOLOGY, (ORAL, ANAL, URETHRAL) ANCILLARY ONLY
Chlamydia: NEGATIVE
Comment: NEGATIVE
Comment: NEGATIVE
Comment: NORMAL
Neisseria Gonorrhea: NEGATIVE
Trichomonas: NEGATIVE

## 2024-01-07 MED ORDER — AMOXICILLIN-POT CLAVULANATE 875-125 MG PO TABS: 1.0000 | ORAL_TABLET | Freq: Two times a day (BID) | ORAL | 0 refills | Status: DC

## 2024-01-07 NOTE — Telephone Encounter (Signed)
Urine culture shows Strep group B, so I sent him a mychart message and Amoxicillin as noted

## 2024-04-26 ENCOUNTER — Emergency Department (HOSPITAL_COMMUNITY)

## 2024-04-26 ENCOUNTER — Emergency Department (HOSPITAL_COMMUNITY)
Admission: EM | Admit: 2024-04-26 | Discharge: 2024-04-26 | Disposition: A | Discharge status: Home / Self Care | Attending: Emergency Medicine | Admitting: Emergency Medicine

## 2024-04-26 ENCOUNTER — Other Ambulatory Visit: Payer: Self-pay

## 2024-04-26 DIAGNOSIS — S299XXA Unspecified injury of thorax, initial encounter: Secondary | ICD-10-CM | POA: Insufficient documentation

## 2024-04-26 DIAGNOSIS — S4992XA Unspecified injury of left shoulder and upper arm, initial encounter: Secondary | ICD-10-CM | POA: Diagnosis present

## 2024-04-26 DIAGNOSIS — S41132A Puncture wound without foreign body of left upper arm, initial encounter: Secondary | ICD-10-CM | POA: Insufficient documentation

## 2024-04-26 DIAGNOSIS — Z23 Encounter for immunization: Secondary | ICD-10-CM | POA: Insufficient documentation

## 2024-04-26 DIAGNOSIS — S51832A Puncture wound without foreign body of left forearm, initial encounter: Secondary | ICD-10-CM | POA: Insufficient documentation

## 2024-04-26 DIAGNOSIS — W3400XA Accidental discharge from unspecified firearms or gun, initial encounter: Secondary | ICD-10-CM | POA: Diagnosis not present

## 2024-04-26 DIAGNOSIS — R791 Abnormal coagulation profile: Secondary | ICD-10-CM | POA: Insufficient documentation

## 2024-04-26 LAB — COMPREHENSIVE METABOLIC PANEL WITH GFR
ALT: 8 U/L (ref 0–44)
AST: 40 U/L (ref 15–41)
Albumin: 4.6 g/dL (ref 3.5–5.0)
Alkaline Phosphatase: 54 U/L (ref 38–126)
Anion gap: 21 — ABNORMAL HIGH (ref 5–15)
BUN: 8 mg/dL (ref 6–20)
CO2: 15 mmol/L — ABNORMAL LOW (ref 22–32)
Calcium: 9.7 mg/dL (ref 8.9–10.3)
Chloride: 102 mmol/L (ref 98–111)
Creatinine, Ser: 1.24 mg/dL (ref 0.61–1.24)
GFR, Estimated: >60 mL/min (ref >60)
Glucose, Bld: 120 mg/dL — ABNORMAL HIGH (ref 70–99)
Potassium: 3.3 mmol/L — ABNORMAL LOW (ref 3.5–5.1)
Sodium: 138 mmol/L (ref 135–145)
Total Bilirubin: 1 mg/dL (ref 0.0–1.2)
Total Protein: 7.5 g/dL (ref 6.5–8.1)

## 2024-04-26 LAB — I-STAT CHEM 8, ED
BUN: 8 mg/dL (ref 6–20)
Calcium, Ion: 1.15 mmol/L (ref 1.15–1.40)
Chloride: 103 mmol/L (ref 98–111)
Creatinine, Ser: 1 mg/dL (ref 0.61–1.24)
Glucose, Bld: 115 mg/dL — ABNORMAL HIGH (ref 70–99)
HCT: 46 % (ref 39.0–52.0)
Hemoglobin: 15.6 g/dL (ref 13.0–17.0)
Potassium: 3.3 mmol/L — ABNORMAL LOW (ref 3.5–5.1)
Sodium: 139 mmol/L (ref 135–145)
TCO2: 18 mmol/L — ABNORMAL LOW (ref 22–32)

## 2024-04-26 LAB — URINALYSIS, ROUTINE W REFLEX MICROSCOPIC
Bilirubin Urine: NEGATIVE
Glucose, UA: NEGATIVE mg/dL
Hgb urine dipstick: NEGATIVE
Ketones, ur: NEGATIVE mg/dL
Leukocytes,Ua: NEGATIVE
Nitrite: NEGATIVE
Protein, ur: NEGATIVE mg/dL
Specific Gravity, Urine: 1.017 (ref 1.005–1.030)
pH: 6 (ref 5.0–8.0)

## 2024-04-26 LAB — CBC
HCT: 45.1 % (ref 39.0–52.0)
Hemoglobin: 15.3 g/dL (ref 13.0–17.0)
MCH: 30.9 pg (ref 26.0–34.0)
MCHC: 33.9 g/dL (ref 30.0–36.0)
MCV: 91.1 fL (ref 80.0–100.0)
Platelets: 256 10*3/uL (ref 150–400)
RBC: 4.95 MIL/uL (ref 4.22–5.81)
RDW: 12.8 % (ref 11.5–15.5)
WBC: 8.5 10*3/uL (ref 4.0–10.5)
nRBC: 0 % (ref 0.0–0.2)

## 2024-04-26 LAB — PROTIME-INR
INR: 1.1 (ref 0.8–1.2)
Prothrombin Time: 14.1 s (ref 11.4–15.2)

## 2024-04-26 LAB — ETHANOL: Alcohol, Ethyl (B): <15 mg/dL (ref <15)

## 2024-04-26 LAB — I-STAT CG4 LACTIC ACID, ED: Lactic Acid, Venous: 11.3 mmol/L (ref 0.5–1.9)

## 2024-04-26 LAB — SAMPLE TO BLOOD BANK

## 2024-04-26 MED ORDER — FENTANYL CITRATE (PF) 100 MCG/2ML IJ SOLN
INTRAMUSCULAR | Status: DC | PRN
  Administered 2024-04-26: 50 ug via INTRAVENOUS

## 2024-04-26 MED ORDER — OXYCODONE-ACETAMINOPHEN 5-325 MG PO TABS
1.0000 | ORAL_TABLET | Freq: Once | ORAL | Status: AC
  Administered 2024-04-26: 1 via ORAL
  Filled 2024-04-26: qty 1

## 2024-04-26 MED ORDER — FENTANYL CITRATE PF 50 MCG/ML IJ SOSY
PREFILLED_SYRINGE | INTRAMUSCULAR | Status: AC
  Filled 2024-04-26: qty 1

## 2024-04-26 MED ORDER — OXYCODONE HCL 5 MG PO TABS: 5.0000 mg | ORAL_TABLET | Freq: Three times a day (TID) | ORAL | 0 refills | Status: DC

## 2024-04-26 MED ORDER — CEFAZOLIN SODIUM-DEXTROSE 2-4 GM/100ML-% IV SOLN
2.0000 g | Freq: Once | INTRAVENOUS | Status: AC
  Administered 2024-04-26: 2 g via INTRAVENOUS

## 2024-04-26 MED ORDER — TETANUS-DIPHTH-ACELL PERTUSSIS 5-2.5-18.5 LF-MCG/0.5 IM SUSY
0.5000 mL | PREFILLED_SYRINGE | Freq: Once | INTRAMUSCULAR | Status: AC
  Administered 2024-04-26: 0.5 mL via INTRAMUSCULAR

## 2024-04-26 MED ORDER — IOHEXOL 350 MG/ML SOLN
75.0000 mL | Freq: Once | INTRAVENOUS | Status: AC | PRN
  Administered 2024-04-26: 75 mL via INTRAVENOUS

## 2024-04-26 MED ORDER — IBUPROFEN 600 MG PO TABS: 600.0000 mg | ORAL_TABLET | Freq: Four times a day (QID) | ORAL | 0 refills | Status: AC | PRN

## 2024-04-26 MED ORDER — KETOROLAC TROMETHAMINE 30 MG/ML IJ SOLN
30.0000 mg | Freq: Once | INTRAMUSCULAR | Status: AC
  Administered 2024-04-26: 30 mg via INTRAVENOUS
  Filled 2024-04-26: qty 1

## 2024-04-26 MED ORDER — CEPHALEXIN 500 MG PO CAPS: 500.0000 mg | ORAL_CAPSULE | Freq: Two times a day (BID) | ORAL | 0 refills | Status: AC
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
## 2024-04-26 NOTE — ED Notes (Signed)
 Pt wounds cleaned and dressing applied.
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
## 2024-04-30 ENCOUNTER — Emergency Department (HOSPITAL_COMMUNITY)

## 2024-04-30 ENCOUNTER — Other Ambulatory Visit: Payer: Self-pay

## 2024-04-30 ENCOUNTER — Encounter (HOSPITAL_COMMUNITY): Payer: Self-pay | Admitting: *Deleted

## 2024-04-30 ENCOUNTER — Emergency Department (HOSPITAL_COMMUNITY)
Admission: EM | Admit: 2024-04-30 | Discharge: 2024-04-30 | Disposition: A | Discharge status: Home / Self Care | Attending: Emergency Medicine | Admitting: Emergency Medicine

## 2024-04-30 DIAGNOSIS — K921 Melena: Secondary | ICD-10-CM | POA: Diagnosis present

## 2024-04-30 LAB — URINALYSIS, ROUTINE W REFLEX MICROSCOPIC
Bilirubin Urine: NEGATIVE
Glucose, UA: NEGATIVE mg/dL
Hgb urine dipstick: NEGATIVE
Ketones, ur: NEGATIVE mg/dL
Leukocytes,Ua: NEGATIVE
Nitrite: NEGATIVE
Protein, ur: NEGATIVE mg/dL
Specific Gravity, Urine: 1.004 — ABNORMAL LOW (ref 1.005–1.030)
pH: 6 (ref 5.0–8.0)

## 2024-04-30 LAB — COMPREHENSIVE METABOLIC PANEL WITH GFR
ALT: 12 U/L (ref 0–44)
AST: 21 U/L (ref 15–41)
Albumin: 4 g/dL (ref 3.5–5.0)
Alkaline Phosphatase: 40 U/L (ref 38–126)
Anion gap: 9 (ref 5–15)
BUN: 6 mg/dL (ref 6–20)
CO2: 27 mmol/L (ref 22–32)
Calcium: 9.7 mg/dL (ref 8.9–10.3)
Chloride: 105 mmol/L (ref 98–111)
Creatinine, Ser: 0.95 mg/dL (ref 0.61–1.24)
GFR, Estimated: >60 mL/min (ref >60)
Glucose, Bld: 102 mg/dL — ABNORMAL HIGH (ref 70–99)
Potassium: 3.9 mmol/L (ref 3.5–5.1)
Sodium: 141 mmol/L (ref 135–145)
Total Bilirubin: 0.9 mg/dL (ref 0.0–1.2)
Total Protein: 6.6 g/dL (ref 6.5–8.1)

## 2024-04-30 LAB — CBC WITH DIFFERENTIAL/PLATELET
Abs Immature Granulocytes: 0 10*3/uL (ref 0.00–0.07)
Basophils Absolute: 0.1 10*3/uL (ref 0.0–0.1)
Basophils Relative: 2 %
Eosinophils Absolute: 0.2 10*3/uL (ref 0.0–0.5)
Eosinophils Relative: 5 %
HCT: 38.6 % — ABNORMAL LOW (ref 39.0–52.0)
Hemoglobin: 13.7 g/dL (ref 13.0–17.0)
Immature Granulocytes: 0 %
Lymphocytes Relative: 43 %
Lymphs Abs: 1.6 10*3/uL (ref 0.7–4.0)
MCH: 31.8 pg (ref 26.0–34.0)
MCHC: 35.5 g/dL (ref 30.0–36.0)
MCV: 89.6 fL (ref 80.0–100.0)
Monocytes Absolute: 0.4 10*3/uL (ref 0.1–1.0)
Monocytes Relative: 11 %
Neutro Abs: 1.4 10*3/uL — ABNORMAL LOW (ref 1.7–7.7)
Neutrophils Relative %: 39 %
Platelets: 209 10*3/uL (ref 150–400)
RBC: 4.31 MIL/uL (ref 4.22–5.81)
RDW: 12.9 % (ref 11.5–15.5)
WBC: 3.7 10*3/uL — ABNORMAL LOW (ref 4.0–10.5)
nRBC: 0 % (ref 0.0–0.2)

## 2024-04-30 LAB — LIPASE, BLOOD: Lipase: 22 U/L (ref 11–51)

## 2024-04-30 LAB — POC OCCULT BLOOD, ED: Fecal Occult Bld: POSITIVE — AB

## 2024-04-30 MED ORDER — MORPHINE SULFATE (PF) 4 MG/ML IV SOLN
4.0000 mg | Freq: Once | INTRAVENOUS | Status: AC
  Administered 2024-04-30: 4 mg via INTRAVENOUS
  Filled 2024-04-30: qty 1

## 2024-04-30 MED ORDER — IOHEXOL 350 MG/ML SOLN
75.0000 mL | Freq: Once | INTRAVENOUS | Status: AC | PRN
  Administered 2024-04-30: 75 mL via INTRAVENOUS

## 2024-04-30 MED ORDER — OXYCODONE HCL 5 MG PO TABS: 5.0000 mg | ORAL_TABLET | ORAL | 0 refills | Status: AC | PRN

## 2024-04-30 NOTE — Discharge Instructions (Addendum)
 Along with the prescribed pain medication, you can use ibuprofen  to aid in pain relief.  A prescription for this has been given, alternate the oxycodone and ibuprofen  every four hours to achieve the best results.  Also, follow up with your surgeon as discussed for further management.
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
## 2024-04-30 NOTE — ED Provider Triage Note (Signed)
 Emergency Medicine Provider Triage Evaluation Note  Eugene Bryant , a 23 y.o. male  was evaluated in triage.  Pt complains of L arm pain and dark blood in stool. Taking double dose of oxycodone as "1 did not help the pain"  Review of Systems  Positive: Pain, intermittent numbness, dark red blood in stool x 2day Negative: Dizziness, CP, SHOB  Physical Exam  BP 127/68 (BP Location: Right Arm)   Pulse 78   Temp 98.4 F (36.9 C)   Resp 14   SpO2 100%  Gen:   Awake, no distress   Resp:  Normal effort  MSK:   Moves extremities without difficulty, L arm in sling. +2 radial pulses  Medical Decision Making  Medically screening exam initiated at 3:36 PM.  Appropriate orders placed.  Ronney Pfarr was informed that the remainder of the evaluation will be completed by another provider, this initial triage assessment does not replace that evaluation, and the importance of remaining in the ED until their evaluation is complete.  Labs ordered   Carie Charity, PA-C 04/30/24 1538

## 2024-05-06 ENCOUNTER — Ambulatory Visit: Admitting: Gastroenterology

## 2024-08-10 ENCOUNTER — Other Ambulatory Visit: Payer: Self-pay

## 2024-08-10 ENCOUNTER — Ambulatory Visit
Admission: EM | Admit: 2024-08-10 | Discharge: 2024-08-10 | Disposition: A | Discharge status: Home / Self Care | Attending: Family Medicine | Admitting: Family Medicine

## 2024-08-10 DIAGNOSIS — R82998 Other abnormal findings in urine: Secondary | ICD-10-CM | POA: Diagnosis present

## 2024-08-10 DIAGNOSIS — Z113 Encounter for screening for infections with a predominantly sexual mode of transmission: Secondary | ICD-10-CM | POA: Insufficient documentation

## 2024-08-10 DIAGNOSIS — R369 Urethral discharge, unspecified: Secondary | ICD-10-CM | POA: Diagnosis present

## 2024-08-10 LAB — POCT URINE DIPSTICK
Bilirubin, UA: NEGATIVE
Blood, UA: NEGATIVE
Glucose, UA: NEGATIVE mg/dL
Ketones, POC UA: NEGATIVE mg/dL
Nitrite, UA: NEGATIVE
POC PROTEIN,UA: NEGATIVE
Spec Grav, UA: 1.01 (ref 1.010–1.025)
Urobilinogen, UA: 0.2 U/dL
pH, UA: 6 (ref 5.0–8.0)

## 2024-08-10 NOTE — Discharge Instructions (Addendum)
 The clinic will contact you with results of the STD testing or urine culture done today if positive.  Follow-up with your PCP if your symptoms do not improve.  Please go to the ER for any worsening symptoms.  Okay feel better soon!

## 2024-08-10 NOTE — ED Provider Notes (Signed)
 [facility]    CSN: [id number] Arrival date & time: 08/10/24  0946      History   Chief Complaint No chief complaint on file.   HPI Eugene Bryant is a 23 y.o. male presents for STD testing/symptoms.  Patient reports 3 days of penile discharge without any fevers, chills, testicular pain or swelling, dysuria.  Has noted some discharge in the urine.  No known exposure.  No other concerns at this time.  HPI  Past Medical History:  Diagnosis Date   Chlamydia    Sickle cell trait (HCC)     There are no active problems to display for this patient.   History reviewed. No pertinent surgical history.     Home Medications    Prior to Admission medications   Medication Sig Start Date End Date Taking? Authorizing Provider  ibuprofen  (ADVIL ) 600 MG tablet Take 1 tablet (600 mg total) by mouth every 6 (six) hours as needed for up to 30 doses for mild pain (pain score 1-3) or moderate pain (pain score 4-6). 04/26/24   Cottie Donnice PARAS, MD  oxyCODONE  (ROXICODONE ) 5 MG immediate release tablet Take 1 tablet (5 mg total) by mouth every 4 (four) hours as needed for severe pain (pain score 7-10). 04/30/24   Henderly, Britni A, PA-C    Family History History reviewed. No pertinent family history.  Social History Social History   Tobacco Use   Smoking status: Never   Smokeless tobacco: Never  Vaping Use   Vaping status: Every Day   Substances: Nicotine  Substance Use Topics   Alcohol use: Yes    Comment: social   Drug use: Yes    Types: Marijuana     Allergies   Patient has no known allergies.   Review of Systems Review of Systems  Genitourinary:  Positive for penile discharge.     Physical Exam Triage Vital Signs ED Triage Vitals  Encounter Vitals Group     BP 08/10/24 1005 120/75     Girls Systolic BP Percentile --      Girls Diastolic BP Percentile --      Boys Systolic BP Percentile --      Boys Diastolic BP Percentile --      Pulse Rate 08/10/24  1005 75     Resp 08/10/24 1005 16     Temp 08/10/24 1005 98.7 F (37.1 C)     Temp Source 08/10/24 1005 Oral     SpO2 08/10/24 1005 98 %     Weight --      Height --      Head Circumference --      Peak Flow --      Pain Score 08/10/24 1004 0     Pain Loc --      Pain Education --      Exclude from Growth Chart --    No data found.  Updated Vital Signs BP 120/75   Pulse 75   Temp 98.7 F (37.1 C) (Oral)   Resp 16   SpO2 98%   Visual Acuity Right Eye Distance:   Left Eye Distance:   Bilateral Distance:    Right Eye Near:   Left Eye Near:    Bilateral Near:     Physical Exam Vitals and nursing note reviewed.  Constitutional:      Appearance: Normal appearance.  HENT:     Head: Normocephalic and atraumatic.  Eyes:     Pupils: Pupils are equal, round, and  reactive to light.  Cardiovascular:     Rate and Rhythm: Normal rate.  Pulmonary:     Effort: Pulmonary effort is normal.  Abdominal:     Tenderness: There is no right CVA tenderness or left CVA tenderness.  Skin:    General: Skin is warm and dry.  Neurological:     General: No focal deficit present.     Mental Status: He is alert and oriented to person, place, and time.  Psychiatric:        Mood and Affect: Mood normal.        Behavior: Behavior normal.      UC Treatments / Results  Labs (all labs ordered are listed, but only abnormal results are displayed) Labs Reviewed  POCT URINE DIPSTICK - Abnormal; Notable for the following components:      Result Value   Leukocytes, UA Trace (*)    All other components within normal limits  URINE CULTURE  CYTOLOGY, (ORAL, ANAL, URETHRAL) ANCILLARY ONLY    EKG   Radiology No results found.  Procedures Procedures (including critical care time)  Medications Ordered in UC Medications - No data to display  Initial Impression / Assessment and Plan / UC Course  I have reviewed the triage vital signs and the nursing notes.  Pertinent labs & imaging  results that were available during my care of the patient were reviewed by me and considered in my medical decision making (see chart for details).     Reviewed exam and symptoms with patient.  Penile swab/STD testing as ordered and will contact for any positive results.  Patient prefers to await results prior to initiating any treatment.  Urine with trace leuks, patient asymptomatic will send for urine culture and contact for any positive results.  Advised patient to follow-up with PCP if symptoms do not improve.  ER precautions reviewed. Final Clinical Impressions(s) / UC Diagnoses   Final diagnoses:  Penile discharge  Leukocytes in urine  Screening examination for STD (sexually transmitted disease)     Discharge Instructions      The clinic will contact you with results of the STD testing done today if positive.     ED Prescriptions   None    PDMP not reviewed this encounter.   Loreda Myla SAUNDERS, NP 08/10/24 1029

## 2024-08-10 NOTE — ED Triage Notes (Signed)
 Pt c/o white strip in urinex3d. Pt wants STI testing

## 2024-08-11 LAB — URINE CULTURE: Culture: NO GROWTH

## 2024-08-11 LAB — CYTOLOGY, (ORAL, ANAL, URETHRAL) ANCILLARY ONLY
Chlamydia: NEGATIVE
Comment: NEGATIVE
Comment: NEGATIVE
Comment: NORMAL
Neisseria Gonorrhea: NEGATIVE
Trichomonas: NEGATIVE
